# Patient Record
Sex: Female | Born: 1965 | Race: White | Hispanic: No | Marital: Married | Smoking: Never smoker
Health system: Southern US, Community
[De-identification: ages and names within clinical notes are randomized; demographics above are authoritative.]

---

## 1998-03-15 ENCOUNTER — Other Ambulatory Visit: Admission: RE | Admit: 1998-03-15 | Discharge: 1998-03-15 | Payer: Self-pay | Admitting: Obstetrics & Gynecology

## 1999-03-04 ENCOUNTER — Other Ambulatory Visit: Admission: RE | Admit: 1999-03-04 | Discharge: 1999-03-04 | Payer: Self-pay | Admitting: Obstetrics and Gynecology

## 2000-02-14 ENCOUNTER — Other Ambulatory Visit: Admission: RE | Admit: 2000-02-14 | Discharge: 2000-02-14 | Payer: Self-pay | Admitting: *Deleted

## 2001-03-19 ENCOUNTER — Other Ambulatory Visit: Admission: RE | Admit: 2001-03-19 | Discharge: 2001-03-19 | Payer: Self-pay | Admitting: Obstetrics and Gynecology

## 2002-03-25 ENCOUNTER — Other Ambulatory Visit: Admission: RE | Admit: 2002-03-25 | Discharge: 2002-03-25 | Payer: Self-pay | Admitting: Obstetrics and Gynecology

## 2003-05-26 ENCOUNTER — Other Ambulatory Visit: Admission: RE | Admit: 2003-05-26 | Discharge: 2003-05-26 | Payer: Self-pay | Admitting: Obstetrics and Gynecology

## 2004-06-01 ENCOUNTER — Other Ambulatory Visit: Admission: RE | Admit: 2004-06-01 | Discharge: 2004-06-01 | Payer: Self-pay | Admitting: Obstetrics and Gynecology

## 2005-06-28 ENCOUNTER — Other Ambulatory Visit: Admission: RE | Admit: 2005-06-28 | Discharge: 2005-06-28 | Payer: Self-pay | Admitting: Obstetrics and Gynecology

## 2006-07-02 ENCOUNTER — Other Ambulatory Visit: Admission: RE | Admit: 2006-07-02 | Discharge: 2006-07-02 | Payer: Self-pay | Admitting: Obstetrics and Gynecology

## 2007-07-29 ENCOUNTER — Encounter: Admission: RE | Admit: 2007-07-29 | Discharge: 2007-07-29 | Payer: Self-pay | Admitting: Obstetrics and Gynecology

## 2009-08-04 ENCOUNTER — Encounter: Admission: RE | Admit: 2009-08-04 | Discharge: 2009-08-04 | Payer: Self-pay | Admitting: Obstetrics and Gynecology

## 2012-10-22 ENCOUNTER — Telehealth: Payer: Self-pay

## 2012-10-22 NOTE — Telephone Encounter (Signed)
TC from pt requesting BC rf. AEX scheduled 11/11/12 with EP. LM for pt to c/b.

## 2012-10-23 NOTE — Telephone Encounter (Signed)
Left another message for pt to cb

## 2012-10-23 NOTE — Telephone Encounter (Signed)
LM for pt to cb 

## 2012-10-24 NOTE — Telephone Encounter (Signed)
Spoke with pt requesting rf BC until AEX 3/31. Pt denies missing pills. Seasonique called to [facility] [place] no rf. Pt agrees and voices understanding.

## 2012-11-11 ENCOUNTER — Other Ambulatory Visit: Payer: Self-pay | Admitting: Obstetrics and Gynecology

## 2012-11-11 DIAGNOSIS — Z1231 Encounter for screening mammogram for malignant neoplasm of breast: Secondary | ICD-10-CM

## 2012-12-11 ENCOUNTER — Ambulatory Visit
Admission: RE | Admit: 2012-12-11 | Discharge: 2012-12-11 | Disposition: A | Discharge status: Home / Self Care | Payer: PRIVATE HEALTH INSURANCE | Source: Ambulatory Visit | Attending: Obstetrics and Gynecology | Admitting: Obstetrics and Gynecology

## 2012-12-11 DIAGNOSIS — Z1231 Encounter for screening mammogram for malignant neoplasm of breast: Secondary | ICD-10-CM

## 2020-12-26 ENCOUNTER — Encounter (HOSPITAL_BASED_OUTPATIENT_CLINIC_OR_DEPARTMENT_OTHER): Payer: Self-pay | Admitting: Emergency Medicine

## 2020-12-26 ENCOUNTER — Other Ambulatory Visit: Payer: Self-pay

## 2020-12-26 ENCOUNTER — Emergency Department (HOSPITAL_BASED_OUTPATIENT_CLINIC_OR_DEPARTMENT_OTHER)
Admission: EM | Admit: 2020-12-26 | Discharge: 2020-12-26 | Disposition: A | Discharge status: Home / Self Care | Payer: PRIVATE HEALTH INSURANCE | Attending: Emergency Medicine | Admitting: Emergency Medicine

## 2020-12-26 ENCOUNTER — Emergency Department (HOSPITAL_BASED_OUTPATIENT_CLINIC_OR_DEPARTMENT_OTHER): Payer: PRIVATE HEALTH INSURANCE

## 2020-12-26 DIAGNOSIS — R1031 Right lower quadrant pain: Secondary | ICD-10-CM | POA: Diagnosis present

## 2020-12-26 DIAGNOSIS — K5732 Diverticulitis of large intestine without perforation or abscess without bleeding: Secondary | ICD-10-CM | POA: Diagnosis not present

## 2020-12-26 LAB — CBC WITH DIFFERENTIAL/PLATELET
Abs Immature Granulocytes: 0.09 10*3/uL — ABNORMAL HIGH (ref 0.00–0.07)
Basophils Absolute: 0.1 10*3/uL (ref 0.0–0.1)
Basophils Relative: 0 %
Eosinophils Absolute: 0.1 10*3/uL (ref 0.0–0.5)
Eosinophils Relative: 0 %
HCT: 38.9 % (ref 36.0–46.0)
Hemoglobin: 13.2 g/dL (ref 12.0–15.0)
Immature Granulocytes: 1 %
Lymphocytes Relative: 13 %
Lymphs Abs: 2.3 10*3/uL (ref 0.7–4.0)
MCH: 32 pg (ref 26.0–34.0)
MCHC: 33.9 g/dL (ref 30.0–36.0)
MCV: 94.4 fL (ref 80.0–100.0)
Monocytes Absolute: 1.3 10*3/uL — ABNORMAL HIGH (ref 0.1–1.0)
Monocytes Relative: 7 %
Neutro Abs: 14.1 10*3/uL — ABNORMAL HIGH (ref 1.7–7.7)
Neutrophils Relative %: 79 %
Platelets: 337 10*3/uL (ref 150–400)
RBC: 4.12 MIL/uL (ref 3.87–5.11)
RDW: 13.1 % (ref 11.5–15.5)
WBC: 17.9 10*3/uL — ABNORMAL HIGH (ref 4.0–10.5)
nRBC: 0 % (ref 0.0–0.2)

## 2020-12-26 LAB — LIPASE, BLOOD: Lipase: 28 U/L (ref 11–51)

## 2020-12-26 LAB — URINALYSIS, ROUTINE W REFLEX MICROSCOPIC
Bilirubin Urine: NEGATIVE
Glucose, UA: 100 mg/dL — AB
Ketones, ur: NEGATIVE mg/dL
Nitrite: NEGATIVE
Protein, ur: NEGATIVE mg/dL
Specific Gravity, Urine: 1.01 (ref 1.005–1.030)
pH: 6 (ref 5.0–8.0)

## 2020-12-26 LAB — COMPREHENSIVE METABOLIC PANEL
ALT: 33 U/L (ref 0–44)
AST: 23 U/L (ref 15–41)
Albumin: 3.6 g/dL (ref 3.5–5.0)
Alkaline Phosphatase: 59 U/L (ref 38–126)
Anion gap: 8 (ref 5–15)
BUN: 8 mg/dL (ref 6–20)
CO2: 22 mmol/L (ref 22–32)
Calcium: 8.5 mg/dL — ABNORMAL LOW (ref 8.9–10.3)
Chloride: 104 mmol/L (ref 98–111)
Creatinine, Ser: 0.51 mg/dL (ref 0.44–1.00)
GFR, Estimated: >60 mL/min (ref >60)
Glucose, Bld: 123 mg/dL — ABNORMAL HIGH (ref 70–99)
Potassium: 3.5 mmol/L (ref 3.5–5.1)
Sodium: 134 mmol/L — ABNORMAL LOW (ref 135–145)
Total Bilirubin: 0.3 mg/dL (ref 0.3–1.2)
Total Protein: 7.2 g/dL (ref 6.5–8.1)

## 2020-12-26 LAB — URINALYSIS, MICROSCOPIC (REFLEX)

## 2020-12-26 LAB — LACTIC ACID, PLASMA: Lactic Acid, Venous: 1.3 mmol/L (ref 0.5–1.9)

## 2020-12-26 MED ORDER — SODIUM CHLORIDE 0.9 % IV BOLUS
1000.0000 mL | Freq: Once | INTRAVENOUS | Status: AC
  Administered 2020-12-26: 1000 mL via INTRAVENOUS

## 2020-12-26 MED ORDER — ONDANSETRON HCL 4 MG/2ML IJ SOLN
4.0000 mg | Freq: Once | INTRAMUSCULAR | Status: AC
  Administered 2020-12-26: 4 mg via INTRAVENOUS
  Filled 2020-12-26: qty 2

## 2020-12-26 MED ORDER — ONDANSETRON HCL 4 MG PO TABS: 4.0000 mg | ORAL_TABLET | Freq: Four times a day (QID) | ORAL | 0 refills | Status: AC

## 2020-12-26 MED ORDER — KETOROLAC TROMETHAMINE 15 MG/ML IJ SOLN
15.0000 mg | Freq: Once | INTRAMUSCULAR | Status: AC
  Administered 2020-12-26: 15 mg via INTRAVENOUS
  Filled 2020-12-26: qty 1

## 2020-12-26 MED ORDER — OXYCODONE-ACETAMINOPHEN 5-325 MG PO TABS: 1.0000 | ORAL_TABLET | Freq: Four times a day (QID) | ORAL | 0 refills | Status: AC | PRN

## 2020-12-26 MED ORDER — SENNA 8.6 MG PO TABS: 1.0000 | ORAL_TABLET | Freq: Every day | ORAL | 0 refills | Status: AC

## 2020-12-26 MED ORDER — METRONIDAZOLE 500 MG PO TABS: 500.0000 mg | ORAL_TABLET | Freq: Three times a day (TID) | ORAL | 0 refills | Status: DC

## 2020-12-26 MED ORDER — ACETAMINOPHEN 325 MG PO TABS: 650.0000 mg | ORAL_TABLET | Freq: Once | ORAL | Status: DC

## 2020-12-26 MED ORDER — SODIUM CHLORIDE 0.9 % IV BOLUS
500.0000 mL | Freq: Once | INTRAVENOUS | Status: AC
Start: 2020-12-26 — End: 2020-12-26
  Administered 2020-12-26: 500 mL via INTRAVENOUS

## 2020-12-26 MED ORDER — MORPHINE SULFATE (PF) 4 MG/ML IV SOLN
4.0000 mg | Freq: Once | INTRAVENOUS | Status: AC
  Administered 2020-12-26: 4 mg via INTRAVENOUS
  Filled 2020-12-26: qty 1

## 2020-12-26 MED ORDER — CIPROFLOXACIN HCL 500 MG PO TABS: 500.0000 mg | ORAL_TABLET | Freq: Two times a day (BID) | ORAL | 0 refills | Status: AC

## 2020-12-26 NOTE — ED Provider Notes (Signed)
[facility] EMERGENCY DEPARTMENT Provider Note   CSN: [id number] Arrival date & time: 12/26/20  1127     History Chief Complaint  Patient presents with  . Abdominal Pain    Sierrah Luevano is a 55 y.o. female.  55 y.o female with no PMH sent to the ED with a chief complaint of right lower quadrant pain with radiation to the left lower quadrant x3 days.  Recently evaluated at urgent care and sent in for further evaluation.  Patient reports eating at a [employer] 3 days ago, thought she likely had some indigestion due to this however pain was exacerbated this morning.  Reports the pain feels like a constant squeezing to the right lower quadrant with some release and dissipation to her left lower quadrant.  Does report drinking "a large amount of Pepto-Bismol this morning ", which did not help with her symptoms.  She did have a normal bowel movement prior to arrival in the ED, reports no straining but did have some pain with release.  Also states when waking up this morning, was walking down the stairs when she felt somewhat nauseated but did not vomit.  She denies any sick contacts, diarrhea, fever, recent travel or blood in her stool.  No urinary symptoms. No past surgical history to her abdomen.  The history is provided by the patient.  Abdominal Pain Pain location:  R flank and RLQ Pain quality: squeezing   Pain radiates to:  LLQ Pain severity:  Moderate Onset quality:  Gradual Duration:  3 days Timing:  Constant Progression:  Worsening Chronicity:  New Context: suspicious food intake   Context: not awakening from sleep, not diet changes and not eating   Relieved by:  Position changes Worsened by:  Nothing Ineffective treatments:  None tried Associated symptoms: nausea   Associated symptoms: no chest pain, no chills, no cough, no diarrhea, no fever, no hematemesis, no shortness of breath, no sore throat and no vomiting        History reviewed. No pertinent past  medical history.  There are no problems to display for this patient.   History reviewed. No pertinent surgical history.   OB History   No obstetric history on file.     No family history on file.  Social History   Tobacco Use  . Smoking status: Never Smoker  . Smokeless tobacco: Never Used  Vaping Use  . Vaping Use: Never used  Substance Use Topics  . Alcohol use: Yes  . Drug use: Never    Home Medications Prior to Admission medications   Medication Sig Start Date End Date Taking? Authorizing Provider  ciprofloxacin (CIPRO) 500 MG tablet Take 1 tablet (500 mg total) by mouth 2 (two) times daily for 7 days. 12/26/20 01/02/21 Yes Emberley Kral, PA-C  ondansetron (ZOFRAN) 4 MG tablet Take 1 tablet (4 mg total) by mouth every 6 (six) hours for 7 days. 12/26/20 01/02/21 Yes Troye Hiemstra, Leonie Douglas, PA-C  oxyCODONE-acetaminophen (PERCOCET/ROXICET) 5-325 MG tablet Take 1 tablet by mouth every 6 (six) hours as needed for up to 3 days for severe pain. 12/26/20 12/29/20 Yes Axie Hayne, Leonie Douglas, PA-C  senna (SENOKOT) 8.6 MG TABS tablet Take 1 tablet (8.6 mg total) by mouth daily for 5 days. 12/26/20 12/31/20 Yes Zahriyah Joo, Leonie Douglas, PA-C    Allergies    Ciprofloxacin and Penicillins  Review of Systems   Review of Systems  Constitutional: Negative for chills and fever.  HENT: Negative for sore throat.   Respiratory: Negative for cough  and shortness of breath.   Cardiovascular: Negative for chest pain.  Gastrointestinal: Positive for abdominal pain and nausea. Negative for diarrhea, hematemesis and vomiting.  Genitourinary: Negative for flank pain.  Neurological: Negative for light-headedness and headaches.  All other systems reviewed and are negative.   Physical Exam Updated Vital Signs BP 132/76   Pulse 99   Temp 98.4 F (36.9 C) (Oral)   Resp 18   Ht 5' 3.5" (1.613 m)   Wt 87.5 kg   LMP 11/28/2020 Comment: signed waiver  SpO2 99%   BMI 33.65 kg/m   Physical Exam Vitals and nursing note reviewed.   Constitutional:      Appearance: She is well-developed.     Comments: Teary-eyed on exam.  HENT:     Head: Normocephalic and atraumatic.     Mouth/Throat:     Mouth: Mucous membranes are moist.  Cardiovascular:     Rate and Rhythm: Normal rate.     Comments: No bilateral leg swelling, no pitting edema. Pulmonary:     Effort: Pulmonary effort is normal.     Breath sounds: No wheezing or rales.     Comments: Lungs are clear to auscultation with any wheezing, rhonchi, rales. Abdominal:     General: Abdomen is flat. Bowel sounds are decreased.     Palpations: Abdomen is soft.     Tenderness: There is abdominal tenderness in the right lower quadrant. There is guarding and rebound. Positive signs include McBurney's sign.     Comments: Significant tenderness to palpation along the right lower quadrant.  There is some guarding noted and some rebound.  No CVA tenderness, bowel sounds are slightly decreased.  Skin:    General: Skin is warm and dry.  Neurological:     Mental Status: She is alert.     ED Results / Procedures / Treatments   Labs (all labs ordered are listed, but only abnormal results are displayed) Labs Reviewed  CBC WITH DIFFERENTIAL/PLATELET - Abnormal; Notable for the following components:      Result Value   WBC 17.9 (*)    Neutro Abs 14.1 (*)    Monocytes Absolute 1.3 (*)    Abs Immature Granulocytes 0.09 (*)    All other components within normal limits  COMPREHENSIVE METABOLIC PANEL - Abnormal; Notable for the following components:   Sodium 134 (*)    Glucose, Bld 123 (*)    Calcium 8.5 (*)    All other components within normal limits  URINALYSIS, ROUTINE W REFLEX MICROSCOPIC - Abnormal; Notable for the following components:   Glucose, UA 100 (*)    Hgb urine dipstick TRACE (*)    Leukocytes,Ua TRACE (*)    All other components within normal limits  URINALYSIS, MICROSCOPIC (REFLEX) - Abnormal; Notable for the following components:   Bacteria, UA FEW (*)     All other components within normal limits  LIPASE, BLOOD  LACTIC ACID, PLASMA    EKG None  Radiology CT ABDOMEN PELVIS WO CONTRAST  Result Date: 12/26/2020 CLINICAL DATA:  55 year old presenting with acute onset of severe acute generalized abdominal pain that began 2 days ago. EXAM: CT ABDOMEN AND PELVIS WITHOUT CONTRAST TECHNIQUE: Multidetector CT imaging of the abdomen and pelvis was performed following the standard protocol without IV contrast. COMPARISON:  None. FINDINGS: Lower chest: Heart size normal.  Visualized lung bases clear. Hepatobiliary: Normal unenhanced appearance of the liver. Solitary small (approximate 7 mm) gallstone in the gallbladder. No pericholecystic edema/inflammation. No biliary ductal dilation. Pancreas:  Normal unenhanced appearance. Spleen: Chest Normal unenhanced appearance. Adrenals/Urinary Tract: Normal appearing adrenal glands. Very small (2-3 mm) non-obstructing calculus in a lower pole calyx of the RIGHT kidney. No urinary tract calculi elsewhere on either side. No hydronephrosis. Allowing for the unenhanced technique, no focal parenchymal abnormality involving either kidney. Normal appearing urinary bladder. Stomach/Bowel: Stomach normal in appearance for the degree of distention. Normal-appearing small bowel. Descending and sigmoid colon diverticulosis. Acute diverticulitis involving the proximal sigmoid colon without evidence of perforation. Mobile cecum located in the RIGHT UPPER QUADRANT. Lipoma involving the ileocecal valve. Normal appearing short, decompressed appendix in the RIGHT mid abdomen. Vascular/Lymphatic: No visible aortoiliofemoral atherosclerosis. No pathologic lymphadenopathy. Reproductive: Retroflexed uterus with heterogeneous myometrium, likely indicating fibroids. Normal appearing ovaries. Minimal free fluid in the cul-de-sac. Other: None. Musculoskeletal: Degenerative disc disease at L5-S1. No acute findings. IMPRESSION: 1. Acute diverticulitis  involving the proximal sigmoid colon. No evidence of perforation or abscess. 2. Non-obstructing very small (2-3 mm) calculus in a lower pole calyx of the RIGHT kidney. No urinary tract calculi elsewhere. 3. Cholelithiasis without evidence of acute cholecystitis. 4. Possible uterine fibroids. Electronically Signed   By: Hulan Saas M.D.   On: 12/26/2020 16:46    Procedures Procedures   Medications Ordered in ED Medications  sodium chloride 0.9 % bolus 1,000 mL (0 mLs Intravenous Stopped 12/26/20 1654)  ondansetron (ZOFRAN) injection 4 mg (4 mg Intravenous Given 12/26/20 1539)  morphine 4 MG/ML injection 4 mg (4 mg Intravenous Given 12/26/20 1539)  ketorolac (TORADOL) 15 MG/ML injection 15 mg (15 mg Intravenous Given 12/26/20 1727)  sodium chloride 0.9 % bolus 500 mL (0 mLs Intravenous Stopped 12/26/20 1800)    ED Course  I have reviewed the triage vital signs and the nursing notes.  Pertinent labs & imaging results that were available during my care of the patient were reviewed by me and considered in my medical decision making (see chart for details).  Clinical Course as of 12/26/20 1842  Sun Dec 26, 2020  1648 WBC(!): 17.9 [JS]  1648 Leukocytes,Ua(!): TRACE [JS]  1730 Lactic Acid, Venous: 1.3 [JS]    Clinical Course User Index [JS] Claude Manges, PA-C   MDM Rules/Calculators/A&P  Patient with no past medical history presents to the ED with 3 days of lower abdominal pain, exacerbated this morning while waking up.  Reports pain has become constant, squeezing feeling like to the right lower quadrant with dissipation to her left lower quadrant.  Tried Pepto-Bismol without much improvement in her symptoms.  Thought discomfort was likely due to indigestion after eating at a [employer] on Friday night, however no one else has been sick with this.  Does report feeling nauseated but without any episodes of emesis.  She arrived in the ED with vitals remarkable for tachycardia, she was  noted to be tachycardic at urgent care.  During my evaluation she appears very uncomfortable, abdomen is soft, localized tenderness to palpation along the right lower quadrant.  There is guarding present, she is teary-eyed during our examination.  Lungs are clear to auscultation, no CVA tenderness denies urinary symptoms.  Moves all upper and lower extremities.  Provided with Zofran, morphine, bolus to help with symptomatic treatment.  This patient was evaluated during a time of global shortage of iodinated contrast media.  Based on guidance from the [employer] of radiology, best practices, and local institutional approaches, an alternative path for evaluating and managing the patient may have been employed in order to provide optimal care during  the shortage.  The current situation has been discussed with the patient.  Interpretation of her labs reveal a CBC with a leukocytosis of 17,000, hemoglobin is within normal limits.  CMP with slight hyponatremia, creatinine levels within normal limits.  LFTs are unremarkable and there is no pain along the right upper quadrant.  UA with trace of leukocytes, no nitrates or signs of infection.  Lipase level is within normal limits.  We discussed imaging to further evaluate her complaints.  CT Abdomen/Pelvis showed:  1. Acute diverticulitis involving the proximal sigmoid colon. No  evidence of perforation or abscess.  2. Non-obstructing very small (2-3 mm) calculus in a lower pole  calyx of the RIGHT kidney. No urinary tract calculi elsewhere.  3. Cholelithiasis without evidence of acute cholecystitis.  4. Possible uterine fibroids.     These results were discussed at length with patient.  She does report an allergy to ciprofloxacin causing her nausea and vomiting.  Will try therapy to help with her diverticulitis.  6:40 PM patient was reassessed by me, does report improvement in her symptoms.  Her lactic acid on today's visit is 1.3, we did discuss  treatment at home with ciprofloxacin, she is requesting medication to help with nausea.  We will also go home on a short prescription of Zofran.  We did discuss pain medication with Percocet, however she will be adding this to senna in order to help prevent constipation. Heart rate has improved to 98 to discharge from the emergency department, she is tolerating p.o. adequately, SIRS criteria evaluated but patient shows improvement after her ED visit.  We did discuss strict return precautions if she develops any worsening pain, fever, vomiting.  Patient understands and agrees with management, return precautions discussed at length.    Portions of this note were generated with Scientist, clinical (histocompatibility and immunogenetics)Dragon dictation software. Dictation errors may occur despite best attempts at proofreading.  Final Clinical Impression(s) / ED Diagnoses Final diagnoses:  Right lower quadrant abdominal pain  Diverticulitis of large intestine without perforation or abscess without bleeding    Rx / DC Orders ED Discharge Orders         Ordered    metroNIDAZOLE (FLAGYL) 500 MG tablet  3 times daily,   Status:  Discontinued        12/26/20 1724    ciprofloxacin (CIPRO) 500 MG tablet  2 times daily        12/26/20 1839    ondansetron (ZOFRAN) 4 MG tablet  Every 6 hours        12/26/20 1839    oxyCODONE-acetaminophen (PERCOCET/ROXICET) 5-325 MG tablet  Every 6 hours PRN        12/26/20 1839    senna (SENOKOT) 8.6 MG TABS tablet  Daily        12/26/20 1839           Claude MangesSoto, Lavontae Cornia, PA-C 12/26/20 1842    Rolan BuccoBelfi, Melanie, MD 12/26/20 2320

## 2020-12-26 NOTE — ED Notes (Signed)
Taken to ct at this time. 

## 2020-12-26 NOTE — ED Triage Notes (Signed)
Pt c/o severe abdominal pain onset Friday. Pt seen at [facility] and was sent here for further evaluation.

## 2020-12-26 NOTE — Discharge Instructions (Addendum)
I have prescribed a biotics to help treat your diverticulitis.  The first antibiotic is Flagyl take 1 tablet 3 times a day for the next 7 days.  The second antibiotic is ciprofloxacin, please take 1 tablet twice a day for the next 7 days.  I have also provided medication to help with nausea, please take 1 tablet every 6 hours as needed while taking this antibiotic.  The third medication is Percocet, this medication should be used for severe pain, please take 1 tablet every 6 hours for the next few days.  Please be aware this medication can cause constipation, therefore I have also written a prescription for senna, please take this daily while you are taking narcotic medication.  If you experience worsening pain, fever, vomiting, worsening symptoms please return to the emergency department.

## 2022-11-23 IMAGING — CT CT ABD-PELV W/O CM
2 of 4 series · 16 of 46 positions shown, 18 images · non-contrast
Comparison: None.

CLINICAL DATA: 54-year-old presenting with acute onset of severe
acute generalized abdominal pain that began 2 days ago.

EXAM:
CT ABDOMEN AND PELVIS WITHOUT CONTRAST
TECHNIQUE: Multidetector CT imaging of the abdomen and pelvis was performed
following the standard protocol without IV contrast.

[Series 2: axial st · axial · 0.82mm/px · z∈[-644,-204]mm · 13 of 96 slices shown, 15 images]
[im 4/96  soft-tissue]
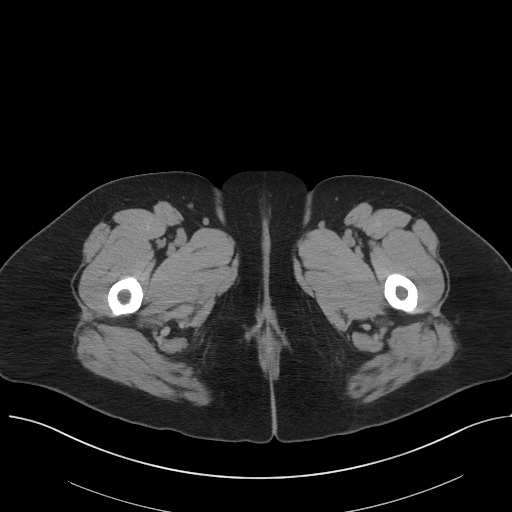
[im 4/96  bone]
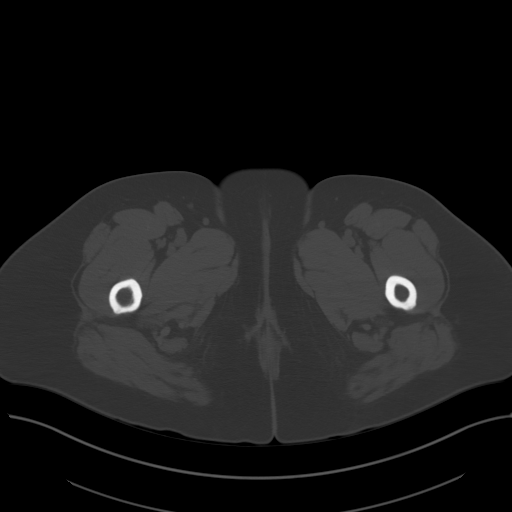
[im 12/96  soft-tissue]
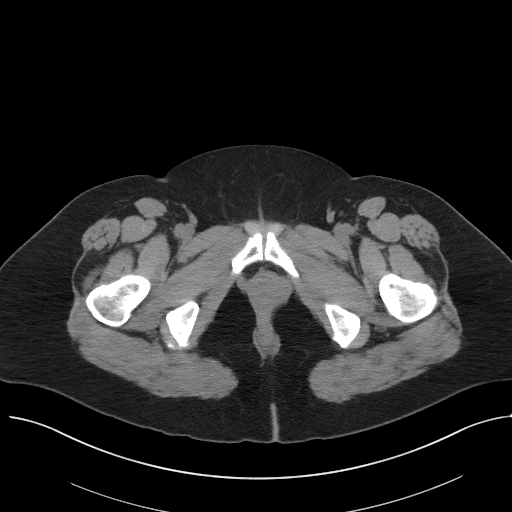
[im 20/96  soft-tissue]
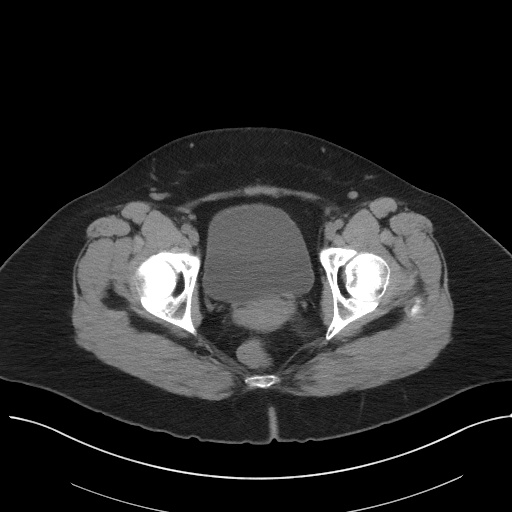
[im 28/96  soft-tissue]
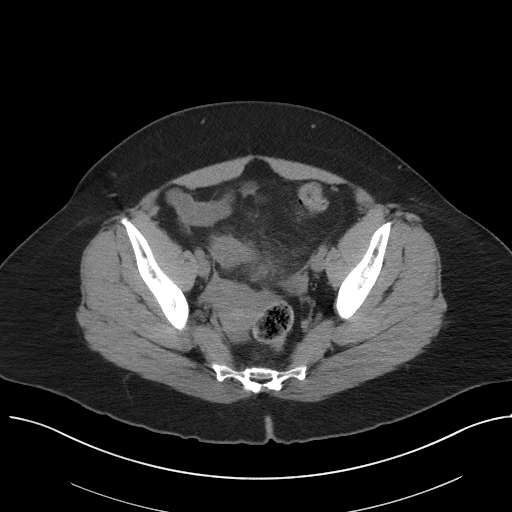
[im 32/96  soft-tissue]
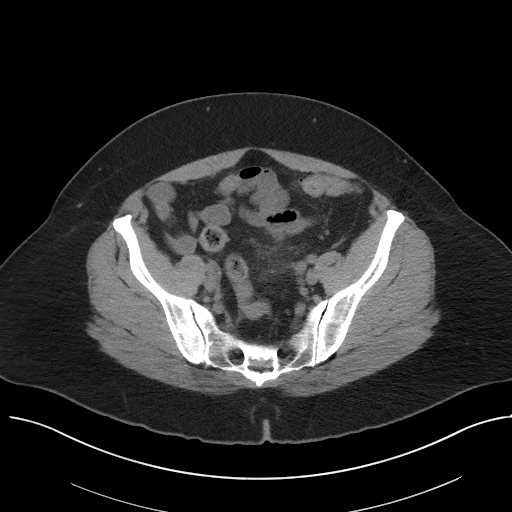
[im 40/96  soft-tissue]
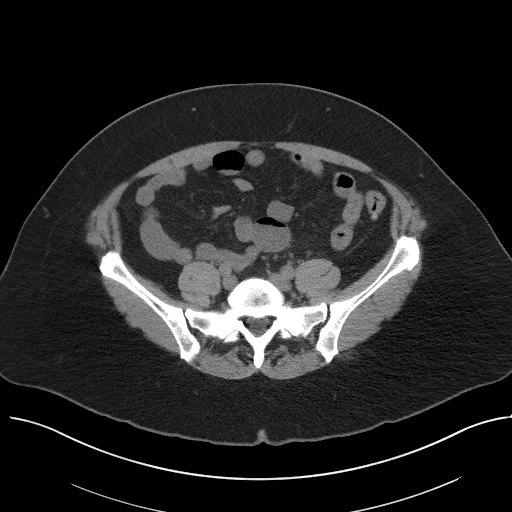
[im 48/96  soft-tissue]
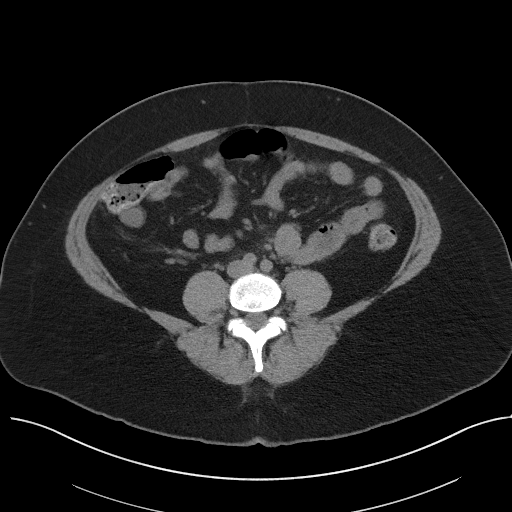
[im 56/96  soft-tissue]
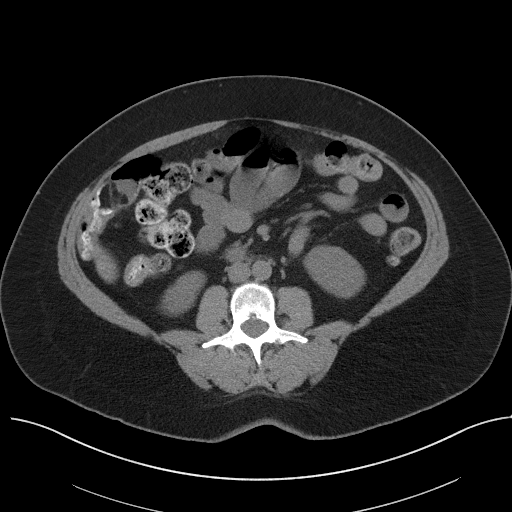
[im 64/96  soft-tissue]
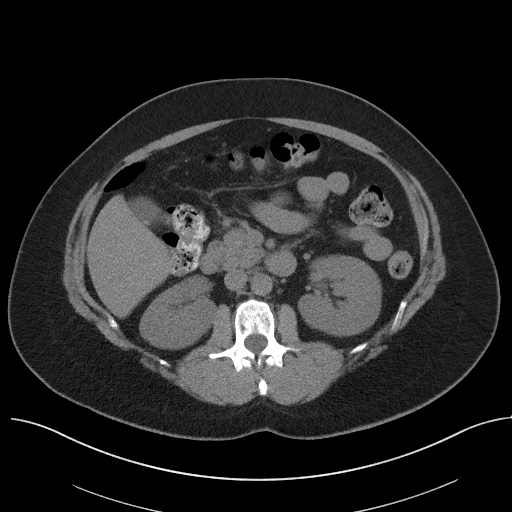
[im 64/96  bone]
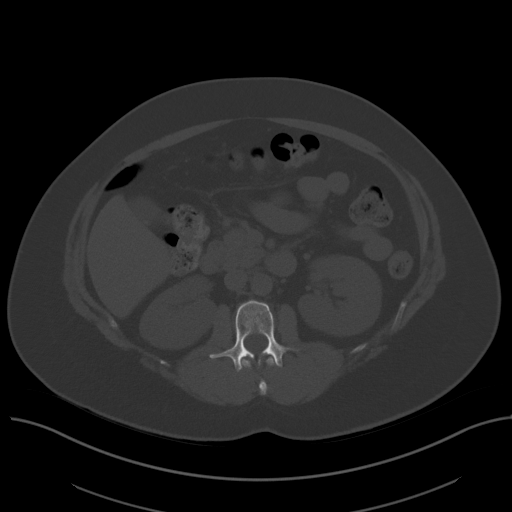
[im 68/96  soft-tissue]
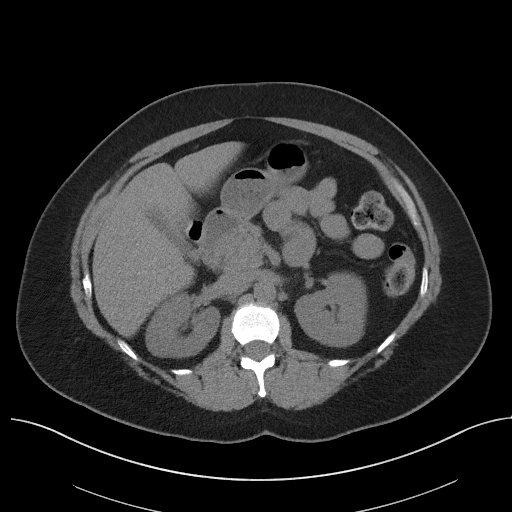
[im 76/96  soft-tissue]
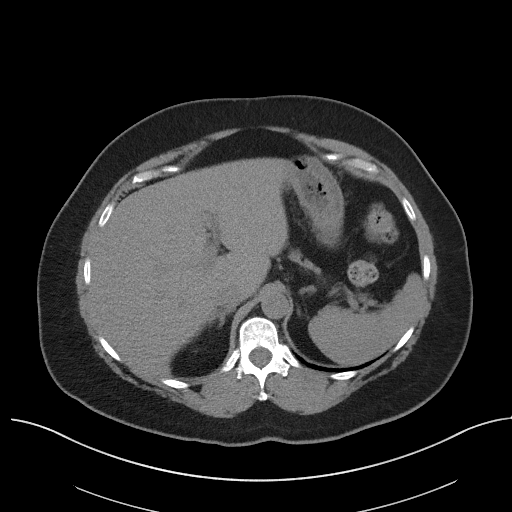
[im 84/96  soft-tissue]
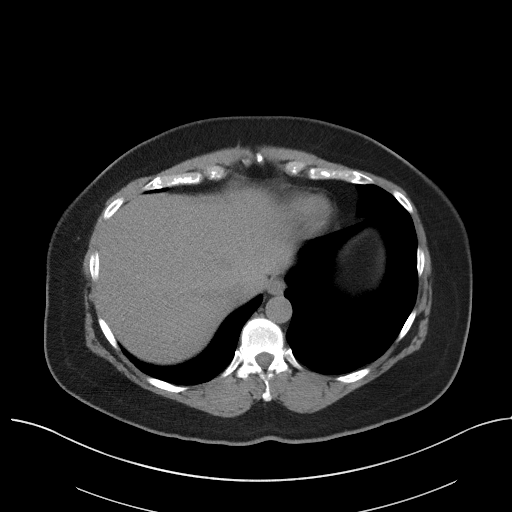
[im 92/96  soft-tissue]
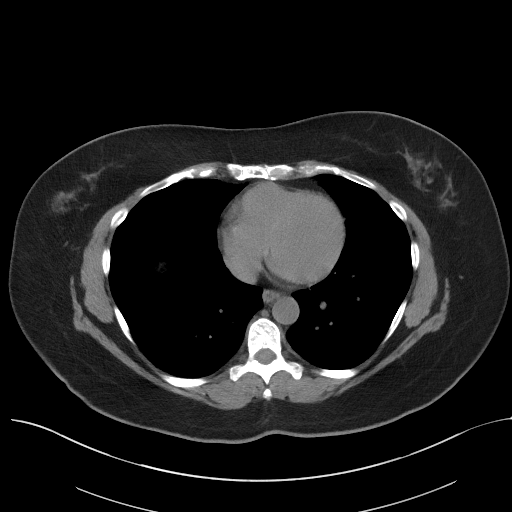

[Series 5: coronal st · coronal · 0.91mm/px · 3 of 109 slices shown]
[im 37/109  soft-tissue]
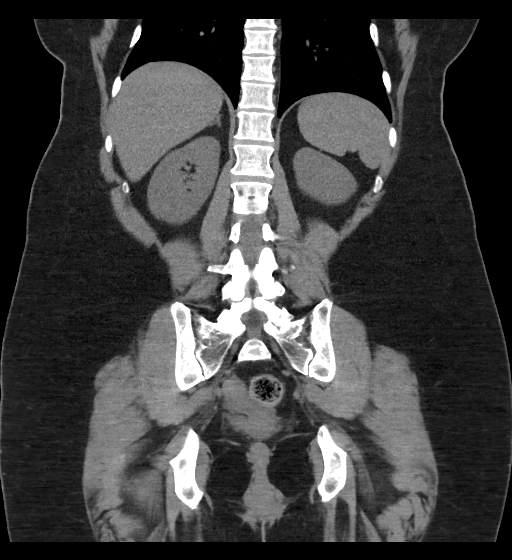
[im 49/109  soft-tissue]
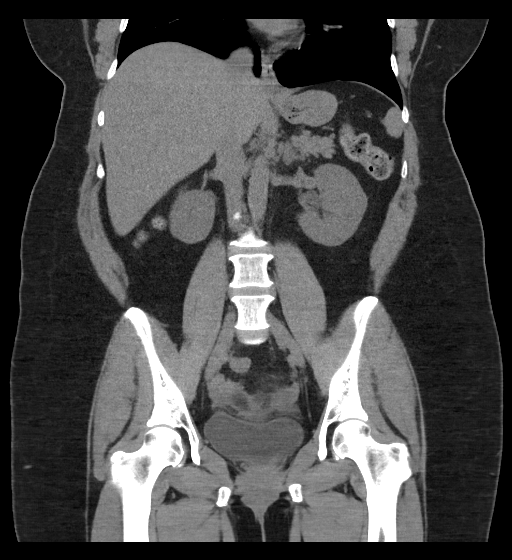
[im 61/109  soft-tissue]
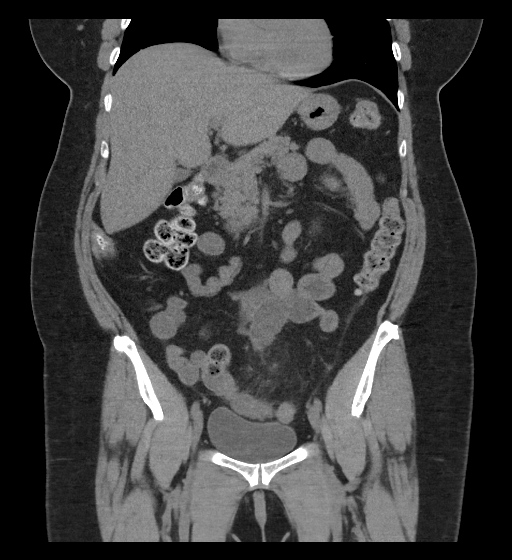

[16 of 46 positions shown; findings below may reference images not displayed]

FINDINGS: Lower chest: Heart size normal.  Visualized lung bases clear.

Hepatobiliary: Normal unenhanced appearance of the liver. Solitary
small (approximate 7 mm) gallstone in the gallbladder. No
pericholecystic edema/inflammation. No biliary ductal dilation.

Pancreas: Normal unenhanced appearance.

Spleen: Chest Normal unenhanced appearance.

Adrenals/Urinary Tract: Normal appearing adrenal glands. Very small
(2-3 mm) non-obstructing calculus in a lower pole calyx of the RIGHT
kidney. No urinary tract calculi elsewhere on either side. No
hydronephrosis. Allowing for the unenhanced technique, no focal
parenchymal abnormality involving either kidney. Normal appearing
urinary bladder.

Stomach/Bowel: Stomach normal in appearance for the degree of
distention. Normal-appearing small bowel. Descending and sigmoid
colon diverticulosis. Acute diverticulitis involving the proximal
sigmoid colon without evidence of perforation. Mobile cecum located
in the RIGHT UPPER QUADRANT. Lipoma involving the ileocecal valve.
Normal appearing short, decompressed appendix in the RIGHT mid
abdomen.

Vascular/Lymphatic: No visible aortoiliofemoral atherosclerosis. No
pathologic lymphadenopathy.

Reproductive: Retroflexed uterus with heterogeneous myometrium,
likely indicating fibroids. Normal appearing ovaries. Minimal free
fluid in the cul-de-sac.

Other: None.

Musculoskeletal: Degenerative disc disease at L5-S1. No acute
findings.
IMPRESSION: 1. Acute diverticulitis involving the proximal sigmoid colon. No
evidence of perforation or abscess.
2. Non-obstructing very small (2-3 mm) calculus in a lower pole
calyx of the RIGHT kidney. No urinary tract calculi elsewhere.
3. Cholelithiasis without evidence of acute cholecystitis.
4. Possible uterine fibroids.

## 2024-02-08 ENCOUNTER — Other Ambulatory Visit: Payer: Self-pay | Admitting: Obstetrics and Gynecology

## 2024-02-08 DIAGNOSIS — Z1231 Encounter for screening mammogram for malignant neoplasm of breast: Secondary | ICD-10-CM

## 2024-03-31 ENCOUNTER — Ambulatory Visit: Payer: PRIVATE HEALTH INSURANCE
